# Patient Record
Sex: Female | Born: 2000
Health system: Southern US, Community
[De-identification: ages and names within clinical notes are randomized; demographics above are authoritative.]

## PROBLEM LIST (undated history)

## (undated) DIAGNOSIS — M4600 Spinal enthesopathy, site unspecified: Secondary | ICD-10-CM

## (undated) DIAGNOSIS — J45909 Unspecified asthma, uncomplicated: Secondary | ICD-10-CM

## (undated) DIAGNOSIS — E282 Polycystic ovarian syndrome: Secondary | ICD-10-CM

---
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
## 2019-07-07 ENCOUNTER — Ambulatory Visit (INDEPENDENT_AMBULATORY_CARE_PROVIDER_SITE_OTHER): Payer: Self-pay | Admitting: Pediatric Endocrinology

## 2019-07-10 ENCOUNTER — Other Ambulatory Visit (INDEPENDENT_AMBULATORY_CARE_PROVIDER_SITE_OTHER): Payer: Self-pay | Admitting: Pediatric Endocrinology

## 2019-07-10 DIAGNOSIS — L680 Hirsutism: Secondary | ICD-10-CM

## 2019-07-10 NOTE — Telephone Encounter (Signed)
°  Who's calling (name and relationship to patient) : Angela Fischer (pt)  Best contact number: [phone]  Provider they see: Baldo Ash  Reason for call: Need new Rx sent to pharmacy     PRESCRIPTION REFILL ONLY  Name of prescription:  Pharmacy: [facility] mail [facility]

## 2019-07-11 ENCOUNTER — Encounter (INDEPENDENT_AMBULATORY_CARE_PROVIDER_SITE_OTHER): Payer: Self-pay | Admitting: Pediatric Endocrinology

## 2019-07-11 ENCOUNTER — Ambulatory Visit (INDEPENDENT_AMBULATORY_CARE_PROVIDER_SITE_OTHER): Payer: 59 | Admitting: Pediatric Endocrinology

## 2019-07-11 ENCOUNTER — Other Ambulatory Visit: Payer: Self-pay

## 2019-07-11 VITALS — BP 110/64 | HR 84 | Ht 66.93 in | Wt 167.2 lb

## 2019-07-11 DIAGNOSIS — Z3041 Encounter for surveillance of contraceptive pills: Secondary | ICD-10-CM | POA: Diagnosis not present

## 2019-07-11 DIAGNOSIS — E288 Other ovarian dysfunction: Secondary | ICD-10-CM

## 2019-07-11 DIAGNOSIS — F411 Generalized anxiety disorder: Secondary | ICD-10-CM | POA: Diagnosis not present

## 2019-07-11 DIAGNOSIS — L680 Hirsutism: Secondary | ICD-10-CM | POA: Diagnosis not present

## 2019-07-11 DIAGNOSIS — F938 Other childhood emotional disorders: Secondary | ICD-10-CM

## 2019-07-11 DIAGNOSIS — Z3202 Encounter for pregnancy test, result negative: Secondary | ICD-10-CM

## 2019-07-11 LAB — POCT URINE PREGNANCY: Preg Test, Ur: NEGATIVE

## 2019-07-11 MED ORDER — NORGESTIMATE-ETH ESTRADIOL 0.25-35 MG-MCG PO TABS: 1.0000 | ORAL_TABLET | Freq: Every day | ORAL | 3 refills | Status: DC

## 2019-07-11 MED ORDER — SPIRONOLACTONE 50 MG PO TABS: 50.0000 mg | ORAL_TABLET | Freq: Every day | ORAL | 4 refills | Status: DC

## 2019-07-11 MED ORDER — HYDROXYZINE HCL 10 MG PO TABS: 10.0000 mg | ORAL_TABLET | Freq: Three times a day (TID) | ORAL | 1 refills | Status: DC | PRN

## 2019-07-11 NOTE — Patient Instructions (Signed)
Atarax as needed up to 3 doses (8 hours apart) per day. May take 2 tabs for sleep if needed.   Let me know if it is working via [facility]  Referral placed to Adolescent Medicine- if you haven't heard from them in a week- please let me know.

## 2019-07-11 NOTE — Progress Notes (Signed)
Subjective:  Subjective  Patient Name: Angela Fischer Date of Birth: 2000/11/02  MRN: [id number]  Angela Fischer  presents to clinic for follow up evaluation and management of her hirsutism.   HISTORY OF PRESENT ILLNESS:   Angela Fischer is a 18 y.o. mixed female   Angela Fischer was accompanied by her self  1. Angela Fischer was seen by her PCP in June 2017 for her 14 year WCC. At that visit she complained of excess body hair on her torso and face. She was referred to endocrinology for further evaluation.    2. Angela Fischer was last seen in pediatric endocrine clinic(telemed) on 12/31/18 n the interim she has been generally healthy.    She has had an increase recently in general anxiety. She did not feel that her Prozac was helpful- she felt that it made her feel like everything was going to kill her.   She has a therapist and last spoke with her 2 weeks ago.   She has continued on Sprintec. She stopped taking the Spironolactone for awhile but has restarted it. She realized it was making her hair finer when she was taking it.   She had a period that last an entire month last month. She was taking her OCP irregularly.  She was at [facility] for a short time (3 weeks). She feels that everything is different since she was there. Her period and discharges are different. She is having more discharge and it has a different odor (not bad) than previously. She also feels that it is thicker.   She feels that when she was at school she gained a lot of weight. There was a [employer], a subway, a coffee place, and burger/pancake place. She felt that she ate out a lot more. She was anxious about going into the dining hall.   She has not been exercising- she started working out more 2 days ago.   Her boy friend has high cholesterol.   She had unprotected intercourse 6 days ago and is anxious that she may be pregnant.    3. Pertinent Review of Systems:  Constitutional: The patient feels "anxious". The patient seems healthy and  active.  Eyes: Vision seems to be good. There are no recognized eye problems. Wears glasses.  Neck: The patient has no complaints of anterior neck swelling, soreness, tenderness, pressure, discomfort, or difficulty swallowing.   Heart: Heart rate increases with exercise or other physical activity. The patient has no complaints of palpitations, irregular heart beats, chest pain, or chest pressure.   Lungs: h/o asthma. +flu vax 2018.  Gastrointestinal: Bowel movents seem normal. The patient has no complaints of excessive hunger, acid reflux, upset stomach, stomach aches or pains, diarrhea, or constipation.  Legs: Muscle mass and strength seem normal. There are no complaints of numbness, tingling, burning, or pain. No edema is noted.  Feet: There are no obvious foot problems. There are no complaints of numbness, tingling, burning, or pain. No edema is noted. Neurologic: There are no recognized problems with muscle movement and strength, sensation, or coordination. GYN/GU: per HPI  Skin: s/p hair removal. Less acne than before.  PAST MEDICAL, FAMILY, AND SOCIAL HISTORY  Past Medical History:  Diagnosis Date  . Asthma   . PCOS (polycystic ovarian syndrome)   . Spinal enthesopathy (HCC)     Family History  Problem Relation Age of Onset  . Arthritis Maternal Grandmother   . Asthma Maternal Grandmother   . Heart disease Maternal Grandmother   . Hypertension Maternal Grandmother   .  Diabetes Maternal Grandmother   . Diabetes Paternal Grandmother   . Sleep apnea Paternal Grandmother      Current Outpatient Medications:  .  norgestimate-ethinyl estradiol (SPRINTEC 28) 0.25-35 MG-MCG tablet, Take 1 tablet by mouth daily., Disp: 3 Package, Rfl: 3 .  spironolactone (ALDACTONE) 50 MG tablet, Take 1 tablet (50 mg total) by mouth daily., Disp: 90 tablet, Rfl: 4 .  cetirizine (ZYRTEC) 10 MG chewable tablet, Chew 10 mg by mouth daily., Disp: , Rfl:  .  hydrOXYzine (ATARAX/VISTARIL) 10 MG tablet,  Take 1 tablet (10 mg total) by mouth 3 (three) times daily as needed (may take 2 tabs for sleep)., Disp: 90 tablet, Rfl: 1  Allergies as of 07/11/2019 - Review Complete 07/11/2019  Allergen Reaction Noted  . Peanuts [peanut oil] Anaphylaxis 06/22/2012  . Corn-containing products  10/18/2017  . Other Other (See Comments) 07/23/2017  . Shellfish allergy Other (See Comments) 06/22/2012  . Soy allergy  10/18/2017     reports that she has never smoked. She has never used smokeless tobacco. She reports that she does not drink alcohol or use drugs. Pediatric History  Patient Parents  . Angela Fischer,Angela Fischer (Mother)  . Nylund,[name] (Father)   Other Topics Concern  . Not on file  Social History Narrative   Goes to [employer], [place]    1. 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  2. Activities: Violin.   3. Primary Care Provider: Silvano RuskMiller, Robert C, MD  ROS: There are no other significant problems involving Angela Fischer's other body systems.    Objective:  Objective  Vital Signs:  BP 110/64   Pulse 84   Ht 5' 6.93" (1.7 m)   Wt 167 lb 3.2 oz (75.8 kg)   BMI 26.24 kg/m    Ht Readings from Last 3 Encounters:  07/11/19 5' 6.93" (1.7 m) (85 %, Z= 1.05)*  07/03/18 5' 7.52" (1.715 m) (90 %, Z= 1.30)*  10/16/17 5' 8.03" (1.728 m) (94 %, Z= 1.53)*   * Growth percentiles are based on CDC (Girls, 2-20 Years) data.   Wt Readings from Last 3 Encounters:  07/11/19 167 lb 3.2 oz (75.8 kg) (92 %, Z= 1.38)*  07/03/18 189 lb 9.6 oz (86 kg) (97 %, Z= 1.85)*  10/16/17 185 lb 6.4 oz (84.1 kg) (97 %, Z= 1.81)*   * Growth percentiles are based on CDC (Girls, 2-20 Years) data.   HC Readings from Last 3 Encounters:  [facility]   Body surface area is 1.89 meters squared. 85 %ile (Z= 1.05) based on CDC (Girls, 2-20 Years) Stature-for-age data based on Stature recorded on 07/11/2019. 92 %ile (Z= 1.38) based on CDC (Girls, 2-20 Years)  weight-for-age data using vitals from 07/11/2019.    PHYSICAL EXAM: Constitutional: The patient appears healthy and well nourished. The patient's height and weight are advanced for age. She has lost 22 pounds in the past year.   Head: The head is normocephalic. Face: The face appears normal. There are no obvious dysmorphic features.fine hair on upper lip, and sideburns. Fine  hair on chin/front of neck. - neck and chin have improved. Side burns and upper lip remain fine hair.   Eyes: The eyes appear to be normally formed and spaced. Gaze is conjugate. There is no obvious arcus or proptosis. Moisture appears normal. Ears: The ears are normally placed and appear externally normal. Mouth: The oropharynx and tongue appear normal. Dentition appears to be normal for age. Oral  moisture is normal. Neck: The neck appears to be visibly normal. No carotid bruits are noted. The thyroid gland is normal in size. The consistency of the thyroid gland is normal. The thyroid gland is not tender to palpation. No acanthosis Lungs: The lungs are clear to auscultation. Air movement is good.  Heart: Heart rate and rhythm are regular. Heart sounds S1 and S2 are normal. I did not appreciate any pathologic cardiac murmurs. Abdomen: The abdomen appears to be normal in size for the patient's age. Bowel sounds are normal. There is no obvious hepatomegaly, splenomegaly, or other mass effect.  Arms: Muscle size and bulk are normal for age.  Hands: There is no obvious tremor. Phalangeal and metacarpophalangeal joints are normal. Palmar muscles are normal for age. Palmar skin is normal. Palmar moisture is also normal. Legs: Muscles appear normal for age. No edema is present. Feet: Feet are normally formed. Dorsalis pedal pulses are normal. Neurologic: Strength is normal for age in both the upper and lower extremities. Muscle tone is normal. Sensation to touch is normal in both the legs and feet.   Puberty: Tanner stage pubic  hair: V Tanner stage breast/genital V.   LAB DATA:   Results for orders placed or performed in visit on 07/11/19 (from the past 672 hour(s))  POCT urine pregnancy   Collection Time: 07/11/19  9:11 AM  Result Value Ref Range   Preg Test, Ur Negative Negative      Assessment and Plan:  Assessment  ASSESSMENT: Latunya is a 18 y.o.  mixed race female who presents today for follow up evaluation of hirsutism and hyperandrogenism.   Hirsutism - She has had signficant laser hair removal - especially across her chest - She continues on spironolactone intermittently for anti androgen effect on hair growth - She feels that she is doing okay - Says that she will need additional laser hair removal  Hyperandrogenism - Continues on Sprintec OCP - Had extended menorrhagia last month but did not contact office - Has been taking OCP more consistently  Sexual Health - Is sexually active - Has had unprotected sex and is concerned that she might be pregnant - U preg negative today - Is taking OCP more consistently - Worried about changes in vaginal secretions  Weight - she feels that she is doing weill with her weight.  - she has reduced sugar drink intake - she is not getting some physical activity currently  Anxiety - She is having significant increase in anxiety since last visit - Was previously on Prozac but felt worse on it - Has been trying to get back in with her therapist (last visit 2 weeks ago) - Discussed options for medication management of anxiety - she wants to try CBD gummies. Discussed that CBD may be beneficial or may make her feel more anxious/paranoid. Discussed that as supplements it is unregulated and you do not know if you are getting any CBD or significantly different amount that labeled.  - Will trial Atarax prn for anxiety - Referral placed to Adolescent Med   PLAN:  1. Diagnostic: u preg neg   2. Therapeutic: continue sprintec and spironolactone. Need to not  skip pills. Start Atarax 10 mg TID PRN anxiety. Can take 20 mg before bed. Patient to send message in 1 week to let me know how she is doing.  3. Patient education: Discussed the above. Focused on sexual health, anxiety 4. Follow-up: Return in about 2 months (around 09/10/2019).  Dessa Phi, MD   Level of Service: This visit lasted in excess of 40 minutes. More than 50% of the visit was devoted to counseling.

## 2019-07-29 ENCOUNTER — Encounter: Payer: 59 | Admitting: Pediatrics

## 2019-07-29 NOTE — Progress Notes (Deleted)
THIS RECORD MAY CONTAIN CONFIDENTIAL INFORMATION THAT SHOULD NOT BE RELEASED WITHOUT REVIEW OF THE SERVICE PROVIDER.  Virtual Visit via Video Note  I connected with Angela Fischer 's {family members:20773}  on 07/29/19 at 11:30 AM EDT by a video enabled telemedicine application and verified that I am speaking with the correct person using two identifiers.    This patient visit was completed through the use of an audio/video or telephone encounter in the setting of the State of Emergency due to the COVID-19 Pandemic.  I discussed that the purpose of this telehealth visit is to provide medical care while limiting exposure to the novel coronavirus.       I discussed the limitations of evaluation and management by telemedicine and the availability of in person appointments.    The {family members:20773} expressed understanding and agreed to proceed.   The patient was physically located at *** in [place] or a state in which I am permitted to provide care. The patient and/or parent/guardian understood that s/he may incur co-pays and cost sharing, and agreed to the telemedicine visit. The visit was reasonable and appropriate under the circumstances given the patient's presentation at the time.   The patient and/or parent/guardian has been advised of the potential risks and limitations of this mode of treatment (including, but not limited to, the absence of in-person examination) and has agreed to be treated using telemedicine. The patient's/patient's family's questions regarding telemedicine have been answered.    As this visit was completed in an ambulatory virtual setting, the patient and/or parent/guardian has also been advised to contact their provider's office for worsening conditions, and seek emergency medical treatment and/or call 911 if the patient deems either necessary.   Team Care Documentation:  *** and I provided team documentation for this visit from [place], [place].  Dr. Delorse Lek  provided services for this visit from off-site.    Chief Complaint:    Angela Fischer is a 18 y.o. female referred by Silvano Rusk, MD here today for evaluation of ***.   Growth Chart Viewed? {YES/NO/NOT APPLICABLE:20182}  Previsit planning completed:  {YES/NO/NOT APPLICABLE:20182}   History was provided by the {CHL AMB PERSONS; PED RELATIVES/OTHER W/PATIENT:[phone]}.  PCP Confirmed?  {YES DJ:24268}  My Chart Activated?   {YES NO:22349}     History of Present Illness:  ***  No LMP recorded.  ROS:  ***  Allergies  Allergen Reactions  . Peanuts [Peanut Oil] Anaphylaxis  . Corn-Containing Products   . Other Other (See Comments)    Lima beans - Reaction unknown  Tree nuts   . Shellfish Allergy Other (See Comments)    Reaction unknown  . Soy Allergy    Outpatient Medications Prior to Visit  Medication Sig Dispense Refill  . cetirizine (ZYRTEC) 10 MG chewable tablet Chew 10 mg by mouth daily.    . hydrOXYzine (ATARAX/VISTARIL) 10 MG tablet Take 1 tablet (10 mg total) by mouth 3 (three) times daily as needed (may take 2 tabs for sleep). 90 tablet 1  . norgestimate-ethinyl estradiol (SPRINTEC 28) 0.25-35 MG-MCG tablet Take 1 tablet by mouth daily. 3 Package 3  . spironolactone (ALDACTONE) 50 MG tablet Take 1 tablet (50 mg total) by mouth daily. 90 tablet 4   No facility-administered medications prior to visit.      Patient Active Problem List   Diagnosis Date Noted  . Surveillance for birth control, oral contraceptives 12/31/2018  . MDD (major depressive disorder), recurrent severe, without psychosis (HCC) 07/24/2017  .  Anxiety disorder of adolescence 07/24/2017  . Disordered eating 07/17/2016  . Hirsutism 04/10/2016  . Hyperandrogenism 04/10/2016  . Overweight peds (BMI 85-94.9 percentile) 04/10/2016    Past Medical History:  Reviewed and updated?  {YES [id number] Past Medical History:  Diagnosis Date  . Asthma   . PCOS (polycystic ovarian syndrome)   .  Spinal enthesopathy ([place])     Family History: Reviewed and updated? {YES [id number] Family History  Problem Relation Age of Onset  . Arthritis Maternal Grandmother   . Asthma Maternal Grandmother   . Heart disease Maternal Grandmother   . Hypertension Maternal Grandmother   . Diabetes Maternal Grandmother   . Diabetes Paternal Grandmother   . Sleep apnea Paternal Grandmother     Confidentiality was discussed with the patient and if applicable, with caregiver as well.  Gender identity: *** Sex assigned at birth: *** Pronouns: {he/she/they:23295} Tobacco?  {YES/NO/WILD VELFY:10175} Drugs/ETOH?  {YES/NO/WILD ZWCHE:52778} Partner preference?  {CHL AMB PARTNER [phone]}  Sexually Active?  {YES/NO/WILD EUMPN:36144}  Pregnancy Prevention:  {Pregnancy Prevention:[phone]} Reviewed condoms:  {YES/NO/WILD RXVQM:08676} Reviewed EC:  {YES/NO/WILD PPJKD:32671}   History or current traumatic events (natural disaster, house fire, etc.)? {YES/NO/WILD IWPYK:99833} History or current physical trauma?  {YES/NO/WILD ASNKN:39767} History or current emotional trauma?  {YES/NO/WILD HALPF:79024} History or current sexual trauma?  {YES/NO/WILD OXBDZ:32992} History or current domestic or intimate partner violence?  {YES/NO/WILD EQAST:41962} History of bullying:  {YES/NO/WILD IWLNL:89211}  Trusted adult at home/school:  {YES/NO/WILD CARDS:18581} Feels safe at home:  {YES/NO/WILD HERDE:08144} Trusted friends:  {YES/NO/WILD YJEHU:31497} Feels safe at school:  {YES/NO/WILD WYOVZ:85885}  Suicidal or homicidal thoughts?   {YES/NO/WILD OYDXA:12878} Self injurious behaviors?  {YES/NO/WILD MVEHM:09470} Guns in the home?  {YES/NO/WILD JGGEZ:66294} {Common ambulatory SmartLinks:19316}  Visual Observations/Objective:  *** General Appearance: Well nourished well developed, in no apparent distress.  Eyes: conjunctiva no swelling or erythema ENT/Mouth: No hoarseness, No cough for duration of  visit.  Neck: Supple  Respiratory: Respiratory effort normal, normal rate, no retractions or distress.   Cardio: Appears well-perfused, noncyanotic Musculoskeletal: no obvious deformity Skin: visible skin without rashes, ecchymosis, erythema Neuro: Awake and oriented X 3,  Psych:  normal affect, Insight and Judgment appropriate.    Assessment/Plan: ***  I discussed the assessment and treatment plan with the patient and/or parent/guardian.  They were provided an opportunity to ask questions and all were answered.  They agreed with the plan and demonstrated an understanding of the instructions. They were advised to call back or seek an in-person evaluation in the emergency room if the symptoms worsen or if the condition fails to improve as anticipated.   Follow-up:   ***  Medical decision-making:   I spent *** minutes on this telehealth visit inclusive of face-to-face video and care coordination time I was located at *** during this encounter.   Jerilee Hoh, MD    CC: Normajean Baxter, MD, Normajean Baxter, MD

## 2019-08-02 NOTE — Progress Notes (Signed)
This encounter was created in error - please disregard.

## 2019-08-20 ENCOUNTER — Telehealth (INDEPENDENT_AMBULATORY_CARE_PROVIDER_SITE_OTHER): Payer: Self-pay | Admitting: Pediatric Endocrinology

## 2019-08-20 ENCOUNTER — Other Ambulatory Visit (INDEPENDENT_AMBULATORY_CARE_PROVIDER_SITE_OTHER): Payer: Self-pay | Admitting: *Deleted

## 2019-08-20 MED ORDER — HYDROXYZINE HCL 10 MG PO TABS: 10.0000 mg | ORAL_TABLET | Freq: Three times a day (TID) | ORAL | 0 refills | Status: DC | PRN

## 2019-08-20 NOTE — Telephone Encounter (Signed)
Routed to [facility].  Are you going to keep refilling this?

## 2019-08-20 NOTE — Telephone Encounter (Signed)
She is scheduled to see Henrene Pastor 12/15- so one more and then they should take over.

## 2019-08-20 NOTE — Telephone Encounter (Signed)
Spoke to patient, advised script sent for 1 month with no refills to get her to her appt with Dr. Henrene Pastor.

## 2019-08-20 NOTE — Telephone Encounter (Signed)
°  Who's calling (name and relationship to patient) : Angela Fischer (patient)  Best contact number: [phone]  Provider they see: [facility]  Reason for call: Need refill of medication    PRESCRIPTION REFILL ONLY  Name of prescription: Hydroxyzine 10mg    Pharmacy: [facility] [address]

## 2019-09-09 ENCOUNTER — Encounter (INDEPENDENT_AMBULATORY_CARE_PROVIDER_SITE_OTHER): Payer: Self-pay | Admitting: Pediatric Endocrinology

## 2019-09-09 ENCOUNTER — Other Ambulatory Visit: Payer: Self-pay

## 2019-09-09 ENCOUNTER — Ambulatory Visit (INDEPENDENT_AMBULATORY_CARE_PROVIDER_SITE_OTHER): Payer: 59 | Admitting: Pediatric Endocrinology

## 2019-09-09 VITALS — BP 108/60 | HR 80 | Ht 67.56 in | Wt 166.6 lb

## 2019-09-09 DIAGNOSIS — E663 Overweight: Secondary | ICD-10-CM

## 2019-09-09 DIAGNOSIS — Z23 Encounter for immunization: Secondary | ICD-10-CM | POA: Diagnosis not present

## 2019-09-09 DIAGNOSIS — E288 Other ovarian dysfunction: Secondary | ICD-10-CM | POA: Diagnosis not present

## 2019-09-09 DIAGNOSIS — Z68.41 Body mass index (BMI) pediatric, 85th percentile to less than 95th percentile for age: Secondary | ICD-10-CM

## 2019-09-09 LAB — POCT GLYCOSYLATED HEMOGLOBIN (HGB A1C): HbA1c POC (<> result, manual entry): 5.5 % (ref 4.0–5.6)

## 2019-09-09 LAB — POCT GLUCOSE (DEVICE FOR HOME USE): POC Glucose: 105 mg/dl — AB (ref 70–99)

## 2019-09-09 NOTE — Patient Instructions (Signed)
Flu shot today! Remember to move that arm! It will take 2 weeks for full immune effect. This injection may not prevent flu but should reduce severity of disease.   Continue Sprintec, spironolactone.   Adolescent Med visit in 2 weeks.

## 2019-09-09 NOTE — Progress Notes (Signed)
Subjective:  Subjective  Patient Name: Angela Fischer Date of Birth: [birth year]  MRN: [id number]  Angela Fischer  presents to clinic for follow up evaluation and management of her hirsutism.   HISTORY OF PRESENT ILLNESS:   Angela Fischer is a 18 y.o. mixed female   Angela Fischer was accompanied by her self   1. Angela Fischer was seen by her PCP in June 2017 for her 14 year [place]. At that visit she complained of excess body hair on her torso and face. She was referred to endocrinology for further evaluation.    2. Angela Fischer was last seen in pediatric endocrine clinic on 07/11/19. In the interim she has been generally healthy.    She has been taking her Atarax mostly for sleep. She hasn't felt that she has needed to take it during the day. She is not taking Prozac. She feels that overall her anxiety is improved. She is scheduled to see Adolescent Med on 12/15.   She has a therapist but hasn't spoken to her recently- Last visit was end of Sept/early Oct. She hasn't felt like she needs her.   She has continued on Sprintec and Spironolactone.   No new issues with hair growth.   She feels that her periods are more consistent now. She is not longer having heavy or long menses. She feels that her vaginal discharge has normalized.   She has not been exercising recently.   She is drinking mostly water and coffee.    3. Pertinent Review of Systems:  Constitutional: The patient feels "good". The patient seems healthy and active.  Eyes: Vision seems to be good. There are no recognized eye problems. Wears glasses.  Neck: The patient has no complaints of anterior neck swelling, soreness, tenderness, pressure, discomfort, or difficulty swallowing.   Heart: Heart rate increases with exercise or other physical activity. The patient has no complaints of palpitations, irregular heart beats, chest pain, or chest pressure.   Lungs: h/o asthma. Flu vax today.  Gastrointestinal: Bowel movents seem normal. The patient has no  complaints of excessive hunger, acid reflux, upset stomach, stomach aches or pains, diarrhea, or constipation.  Legs: Muscle mass and strength seem normal. There are no complaints of numbness, tingling, burning, or pain. No edema is noted.  Feet: There are no obvious foot problems. There are no complaints of numbness, tingling, burning, or pain. No edema is noted. Neurologic: There are no recognized problems with muscle movement and strength, sensation, or coordination. GYN/GU: per HPI  Skin: s/p hair removal. Less acne than before.  PAST MEDICAL, FAMILY, AND SOCIAL HISTORY  Past Medical History:  Diagnosis Date  . Asthma   . PCOS (polycystic ovarian syndrome)   . Spinal enthesopathy (HCC)     Family History  Problem Relation Age of Onset  . Arthritis Maternal Grandmother   . Asthma Maternal Grandmother   . Heart disease Maternal Grandmother   . Hypertension Maternal Grandmother   . Diabetes Maternal Grandmother   . Diabetes Paternal Grandmother   . Sleep apnea Paternal Grandmother      Current Outpatient Medications:  .  hydrOXYzine (ATARAX/VISTARIL) 10 MG tablet, Take 1 tablet (10 mg total) by mouth 3 (three) times daily as needed (may take 2 tabs for sleep)., Disp: 60 tablet, Rfl: 0 .  norgestimate-ethinyl estradiol (SPRINTEC 28) 0.25-35 MG-MCG tablet, Take 1 tablet by mouth daily., Disp: 3 Package, Rfl: 3 .  spironolactone (ALDACTONE) 50 MG tablet, Take 1 tablet (50 mg total) by mouth daily., Disp: 90 tablet, Rfl:  4 .  cetirizine (ZYRTEC) 10 MG chewable tablet, Chew 10 mg by mouth daily., Disp: , Rfl:   Allergies as of 09/09/2019 - Review Complete 09/09/2019  Allergen Reaction Noted  . Peanuts [peanut oil] Anaphylaxis 06/22/2012  . Corn-containing products  10/18/2017  . Other Other (See Comments) 07/23/2017  . Shellfish allergy Other (See Comments) 06/22/2012  . Soy allergy  10/18/2017     reports that she has never smoked. She has never used smokeless tobacco. She  reports that she does not drink alcohol or use drugs. Pediatric History  Patient Parents  . [name],[name] (Mother)  . Vitale,[name] (Father)   Other Topics Concern  . Not on file  Social History Narrative   Goes to [employer], [place]    1. 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 She is thinking of changing her major and doing a minor in performance. She is doing remote school.  2. Activities: Violin.   3. Primary Care Provider: Gweneth DimitriMcNeill, Wendy, MD  ROS: There are no other significant problems involving Angela Fischer's other body systems.    Objective:  Objective  Vital Signs:  BP 108/60   Pulse 80   Ht 5' 7.56" (1.716 m)   Wt 166 lb 9.6 oz (75.6 kg)   LMP 09/07/2019   BMI 25.66 kg/m   Ht Readings from Last 3 Encounters:  09/09/19 5' 7.56" (1.716 m) (90 %, Z= 1.29)*  07/11/19 5' 6.93" (1.7 m) (85 %, Z= 1.05)*  07/03/18 5' 7.52" (1.715 m) (90 %, Z= 1.30)*   * Growth percentiles are based on CDC (Girls, 2-20 Years) data.   Wt Readings from Last 3 Encounters:  09/09/19 166 lb 9.6 oz (75.6 kg) (91 %, Z= 1.36)*  07/11/19 167 lb 3.2 oz (75.8 kg) (92 %, Z= 1.38)*  07/03/18 189 lb 9.6 oz (86 kg) (97 %, Z= 1.85)*   * Growth percentiles are based on CDC (Girls, 2-20 Years) data.   HC Readings from Last 3 Encounters:  No data found for [place]   Body surface area is 1.9 meters squared. 90 %ile (Z= 1.29) based on CDC (Girls, 2-20 Years) Stature-for-age data based on Stature recorded on 09/09/2019. 91 %ile (Z= 1.36) based on CDC (Girls, 2-20 Years) weight-for-age data using vitals from 09/09/2019.   PHYSICAL EXAM:  Constitutional: The patient appears healthy and well nourished. The patient's height and weight are advanced for age. Weight is fairly stable.    Head: The head is normocephalic. Face: The face appears normal. There are no obvious dysmorphic features.fine hair on upper lip, and sideburns. Fine  hair on chin/front of neck. -  neck and chin have improved. Side burns and upper lip remain fine hair.   Eyes: The eyes appear to be normally formed and spaced. Gaze is conjugate. There is no obvious arcus or proptosis. Moisture appears normal. Ears: The ears are normally placed and appear externally normal. Mouth: The oropharynx and tongue appear normal. Dentition appears to be normal for age. Oral moisture is normal. Neck: The neck appears to be visibly normal. No carotid bruits are noted. The thyroid gland is normal in size. The consistency of the thyroid gland is normal. The thyroid gland is not tender to palpation. No acanthosis Lungs: The lungs are clear to auscultation. Air movement is good.  Heart: Heart rate and rhythm are regular. Heart sounds S1 and S2 are normal. I did not appreciate any pathologic cardiac murmurs. Abdomen: The abdomen appears to be  normal in size for the patient's age. Bowel sounds are normal. There is no obvious hepatomegaly, splenomegaly, or other mass effect.  Arms: Muscle size and bulk are normal for age.  Hands: There is no obvious tremor. Phalangeal and metacarpophalangeal joints are normal. Palmar muscles are normal for age. Palmar skin is normal. Palmar moisture is also normal. Legs: Muscles appear normal for age. No edema is present. Feet: Feet are normally formed. Dorsalis pedal pulses are normal. Neurologic: Strength is normal for age in both the upper and lower extremities. Muscle tone is normal. Sensation to touch is normal in both the legs and feet.   Puberty: Tanner stage pubic hair: V Tanner stage breast/genital V.   LAB DATA:    Results for orders placed or performed in visit on 09/09/19 (from the past 672 hour(s))  POCT Glucose (Device for Home Use)   Collection Time: 09/09/19 10:39 AM  Result Value Ref Range   Glucose Fasting, POC     POC Glucose 105 (A) 70 - 99 mg/dl  POCT HgB W3S   Collection Time: 09/09/19 10:41 AM  Result Value Ref Range   Hemoglobin A1C     HbA1c  POC (<> result, manual entry) 5.5 4.0 - 5.6 %   HbA1c, POC (prediabetic range)     HbA1c, POC (controlled diabetic range)        Assessment and Plan:  Assessment  ASSESSMENT: Angela Fischer is a 18 y.o.  mixed race female who presents today for follow up evaluation of hirsutism and hyperandrogenism.   Hirsutism - She has had signficant laser hair removal - especially across her chest - She continues on spironolactone for anti androgen effect on hair growth - She feels that she is doing okay - Says that she will need additional laser hair removal  Hyperandrogenism - Continues on Sprintec OCP - Has been taking OCP more consistently - Is no longer having breakthrough bleeding or menorrhagia  Sexual Health - Is sexually active - Is taking OCP more consistently - Scheduled with adolescent medicine later this month  Weight - she feels that she is doing weill with her weight.  - she has reduced sugar drink intake - she is not getting some physical activity currently  Anxiety - Feels anxiety is well controlled on Atarax.  - Was previously on Prozac but felt worse on it - Scheduled to see Adolescent medicine later this month.    PLAN:   1. Diagnostic: A1C today as above.  2. Therapeutic: continue sprintec and spironolactone. Need to not skip pills.  Atarax 10 mg TID PRN anxiety. Can take 20 mg before bed.  3. Patient education: Discussed the above.  4. Follow-up: Return in about 6 months (around 03/09/2020).       Dessa Phi, MD  Level of Service: This visit lasted in excess of 25 minutes. More than 50% of the visit was devoted to counseling.

## 2019-09-23 ENCOUNTER — Other Ambulatory Visit (HOSPITAL_COMMUNITY)
Admission: RE | Admit: 2019-09-23 | Discharge: 2019-09-23 | Disposition: A | Discharge status: Home / Self Care | Payer: 59 | Source: Ambulatory Visit | Attending: Pediatrics | Admitting: Pediatrics

## 2019-09-23 ENCOUNTER — Ambulatory Visit (INDEPENDENT_AMBULATORY_CARE_PROVIDER_SITE_OTHER): Payer: 59 | Admitting: Pediatrics

## 2019-09-23 ENCOUNTER — Other Ambulatory Visit: Payer: Self-pay

## 2019-09-23 VITALS — BP 116/73 | HR 81 | Ht 67.72 in | Wt 171.0 lb

## 2019-09-23 DIAGNOSIS — Z113 Encounter for screening for infections with a predominantly sexual mode of transmission: Secondary | ICD-10-CM | POA: Diagnosis not present

## 2019-09-23 DIAGNOSIS — F411 Generalized anxiety disorder: Secondary | ICD-10-CM

## 2019-09-23 DIAGNOSIS — F332 Major depressive disorder, recurrent severe without psychotic features: Secondary | ICD-10-CM

## 2019-09-23 DIAGNOSIS — G479 Sleep disorder, unspecified: Secondary | ICD-10-CM

## 2019-09-23 DIAGNOSIS — Z3202 Encounter for pregnancy test, result negative: Secondary | ICD-10-CM

## 2019-09-23 DIAGNOSIS — F509 Eating disorder, unspecified: Secondary | ICD-10-CM

## 2019-09-23 DIAGNOSIS — L680 Hirsutism: Secondary | ICD-10-CM

## 2019-09-23 LAB — POCT URINE PREGNANCY: Preg Test, Ur: NEGATIVE

## 2019-09-23 MED ORDER — HYDROXYZINE HCL 25 MG PO TABS: 25.0000 mg | ORAL_TABLET | Freq: Four times a day (QID) | ORAL | 1 refills | Status: DC | PRN

## 2019-09-23 MED ORDER — NORGESTIMATE-ETH ESTRADIOL 0.25-35 MG-MCG PO TABS: 1.0000 | ORAL_TABLET | Freq: Every day | ORAL | 3 refills | Status: DC

## 2019-09-23 MED ORDER — SERTRALINE HCL 50 MG PO TABS: 50.0000 mg | ORAL_TABLET | Freq: Every day | ORAL | 2 refills | Status: DC

## 2019-09-23 MED ORDER — HYDROXYZINE HCL 25 MG PO TABS: 25.0000 mg | ORAL_TABLET | Freq: Four times a day (QID) | ORAL | 0 refills | Status: DC | PRN

## 2019-09-23 NOTE — Patient Instructions (Addendum)
Www.besider.org has great information about birth control options.   Can use one to two tablets of hydroxyzine for sleep.   Please reachout if increased anxiety with starting sertraline
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
## 2019-09-24 ENCOUNTER — Encounter (INDEPENDENT_AMBULATORY_CARE_PROVIDER_SITE_OTHER): Payer: Self-pay

## 2019-10-02 LAB — URINE CYTOLOGY ANCILLARY ONLY
Bacterial Vaginitis-Urine: NEGATIVE
Candida Urine: NEGATIVE
Chlamydia: NEGATIVE
Comment: NEGATIVE
Comment: NEGATIVE
Comment: NORMAL
Neisseria Gonorrhea: NEGATIVE
Trichomonas: NEGATIVE

## 2019-10-07 ENCOUNTER — Ambulatory Visit (INDEPENDENT_AMBULATORY_CARE_PROVIDER_SITE_OTHER): Payer: 59 | Admitting: Pediatrics

## 2019-10-07 DIAGNOSIS — E282 Polycystic ovarian syndrome: Secondary | ICD-10-CM | POA: Diagnosis not present

## 2019-10-07 DIAGNOSIS — F4323 Adjustment disorder with mixed anxiety and depressed mood: Secondary | ICD-10-CM | POA: Diagnosis not present

## 2019-10-07 DIAGNOSIS — Z3041 Encounter for surveillance of contraceptive pills: Secondary | ICD-10-CM | POA: Diagnosis not present

## 2019-10-07 MED ORDER — DESVENLAFAXINE SUCCINATE ER 25 MG PO TB24: 25.0000 mg | ORAL_TABLET | Freq: Every day | ORAL | 1 refills | Status: DC
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
## 2019-10-21 ENCOUNTER — Ambulatory Visit (INDEPENDENT_AMBULATORY_CARE_PROVIDER_SITE_OTHER): Payer: 59 | Admitting: Pediatrics

## 2019-10-21 DIAGNOSIS — J4520 Mild intermittent asthma, uncomplicated: Secondary | ICD-10-CM | POA: Insufficient documentation

## 2019-10-21 DIAGNOSIS — T782XXA Anaphylactic shock, unspecified, initial encounter: Secondary | ICD-10-CM | POA: Insufficient documentation

## 2019-10-21 DIAGNOSIS — F411 Generalized anxiety disorder: Secondary | ICD-10-CM | POA: Diagnosis not present

## 2019-10-21 DIAGNOSIS — F4323 Adjustment disorder with mixed anxiety and depressed mood: Secondary | ICD-10-CM | POA: Diagnosis not present

## 2019-10-21 DIAGNOSIS — U071 COVID-19: Secondary | ICD-10-CM | POA: Diagnosis not present

## 2019-10-21 DIAGNOSIS — G44209 Tension-type headache, unspecified, not intractable: Secondary | ICD-10-CM | POA: Diagnosis not present

## 2019-10-21 MED ORDER — DESVENLAFAXINE SUCCINATE ER 25 MG PO TB24: 50.0000 mg | ORAL_TABLET | Freq: Every day | ORAL | 0 refills | Status: DC

## 2019-10-21 NOTE — Progress Notes (Signed)
This note is not being shared with the patient for the following reason: To respect privacy (The patient or proxy has requested that the information not be shared).  THIS RECORD MAY CONTAIN CONFIDENTIAL INFORMATION THAT SHOULD NOT BE RELEASED WITHOUT REVIEW OF THE SERVICE PROVIDER.  Virtual Follow-Up Visit via Video Note  I connected with Angela Fischer 's patient  on 10/21/19 at  1:30 PM EST by a video enabled telemedicine application and verified that I am speaking with the correct person using two identifiers.    This patient visit was completed through the use of an audio/video or telephone encounter in the setting of the State of Emergency due to the COVID-19 Pandemic.  I discussed that the purpose of this telehealth visit is to provide medical care while limiting exposure to the novel coronavirus.       I discussed the limitations of evaluation and management by telemedicine and the availability of in person appointments.    The patient  expressed understanding and agreed to proceed.   The patient was physically located at [place] in [place] or a state in which I am permitted to provide care. The patient and/or parent/guardian understood that s/he may incur co-pays and cost sharing, and agreed to the telemedicine visit. The visit was reasonable and appropriate under the circumstances given the patient's presentation at the time.   The patient and/or parent/guardian has been advised of the potential risks and limitations of this mode of treatment (including, but not limited to, the absence of in-person examination) and has agreed to be treated using telemedicine. The patient's/patient's family's questions regarding telemedicine have been answered.    As this visit was completed in an ambulatory virtual setting, the patient and/or parent/guardian has also been advised to contact their provider's office for worsening conditions, and seek emergency medical treatment and/or call 911 if  the patient deems either necessary.   Team Care Documentation:  Team care documentation used during this visit? yes Team care members present and location: Angela Fischer, [facility] [name] is a 19 y.o. female with a history of anxiety, depression, referred by [name], MD here today for follow-up of her anxiety.    Growth Chart Viewed? yes  Previsit planning completed:  yes   History was provided by the patient.  PCP Confirmed?  yes  My Chart Activated?   yes    Plan from Last Visit:    At her last visit, patient described significant anxiety including, paranoid about things she cannot control, obsessive thoughts about fears, and anxiety about returning to school. At her last visit, it had ben 2-3 months since she saw her therapist [name]) and planned to reach out.   She had been started on sertraline in the past given her dificulty functioning due to her symptoms. At her last visit she reported improvement in anxiety with sertraline, however felt more activated (more paranoid). Of note, she has been on fluoxetine in the past as well and had similar response. Thus she was started on Pristiq at 25 mg 2 weeks ago with a plan to increase in 2-3 weeks if needed.  Chief Complaint: anxiety  History of Present Illness:   She started the Pristiq 2 weeks ago, she remembers to take in 5-6 days a week.  Current symptoms: She reports that she feels fine and that she won't really know if it is working until she gets back to school since that is a big stressor for her. She  has had some headaches.  hydroxyzine use: always takes if for sleep; has been taking it the same  Therapy: has not scheduled appt; therapist was not in town. Will call today for appt. Describes relationship with therapist as good.  SI: none, no HI Every day headaches for the last week or so but was tested for COVID yesterday (1/11) and is positive after a known exposure last Monday. No other  appreciable symptoms.  No nausea/vomiting Sleep: hard to say; has bad dreams a lot; no issues with sleep initiation or staying asleep; has nightmares - comes and goes, had these before medication start    Patient's last menstrual period was 10/02/2019.  Review of Systems  Constitutional: Negative for chills, fever and malaise/fatigue.  HENT: Negative for sore throat.   Eyes: Negative for blurred vision.  Respiratory: Negative for cough and shortness of breath.   Cardiovascular: Negative for chest pain and palpitations.  Gastrointestinal: Negative for abdominal pain and nausea.  Genitourinary: Negative for dysuria and urgency.  Musculoskeletal: Negative for myalgias.  Skin: Negative for rash.  Neurological: Positive for headaches.  Psychiatric/Behavioral: Positive for depression. The patient is nervous/anxious.     Allergies  Allergen Reactions  . Peanuts [Peanut Oil] Anaphylaxis  . Corn-Containing Products   . Other Other (See Comments)    Lima beans - Reaction unknown  Tree nuts   . Shellfish Allergy Other (See Comments)    Reaction unknown  . Soy Allergy    Outpatient Medications Prior to Visit  Medication Sig Dispense Refill  . cetirizine (ZYRTEC) 10 MG chewable tablet Chew 10 mg by mouth daily.    . hydrOXYzine (ATARAX/VISTARIL) 25 MG tablet Take 1 tablet (25 mg total) by mouth every 6 (six) hours as needed for anxiety. Take 25 mg at bedtime 45 tablet 1  . norgestimate-ethinyl estradiol (SPRINTEC 28) 0.25-35 MG-MCG tablet Take 1 tablet by mouth daily. 3 Package 3  . spironolactone (ALDACTONE) 50 MG tablet Take 1 tablet (50 mg total) by mouth daily. 90 tablet 4  . Desvenlafaxine Succinate ER (PRISTIQ) 25 MG TB24 Take 25 mg by mouth daily. 30 tablet 1  . sertraline (ZOLOFT) 50 MG tablet Take 1 tablet (50 mg total) by mouth daily. 30 tablet 2   No facility-administered medications prior to visit.     Patient Active Problem List   Diagnosis Date Noted  . Asthma,  intermittent 10/21/2019  . Tension headache 10/21/2019  . Anaphylaxis 10/21/2019  . COVID-19 10/21/2019  . Surveillance for birth control, oral contraceptives 12/31/2018  . MDD (major depressive disorder), recurrent severe, without psychosis ([place]) 07/24/2017  . Anxiety disorder of adolescence 07/24/2017  . Disordered eating 07/17/2016  . Hirsutism 04/10/2016  . Hyperandrogenism 04/10/2016  . Overweight peds (BMI 85-94.9 percentile) 04/10/2016    Past Medical History:  Reviewed and updated?  yes Past Medical History:  Diagnosis Date  . Asthma   . PCOS (polycystic ovarian syndrome)   . Spinal enthesopathy ([place])     Family History: Reviewed and updated? yes Family History  Problem Relation Age of Onset  . Arthritis Maternal Grandmother   . Asthma Maternal Grandmother   . Heart disease Maternal Grandmother   . Hypertension Maternal Grandmother   . Diabetes Maternal Grandmother   . Diabetes Paternal Grandmother   . Sleep apnea Paternal Grandmother     Confidentiality was discussed with the patient and if applicable, with caregiver as well.  The following portions of the patient's history were reviewed and updated as appropriate: allergies,  current medications, past family history, past medical history, past social history, past surgical history and problem list.  Visual Observations/Objective:   General Appearance: Well nourished well developed, in no apparent distress.  ENT/Mouth: No hoarseness, No cough for duration of visit.  Respiratory: Respiratory effort normal, no distress.   Neuro: Awake and oriented X 3,  Psych:  normal affect, Insight and Judgment appropriate.    Assessment/Plan: 1. Adjustment disorder with mixed anxiety and depressed mood -increased from pristiq 25 mg to 50 mg ER daily  -continue with therapy, schedule ASAP   2. COVID-19 -since exposure headaches were more prevalent, likely symptomatic for COVID  -advised 10-14 days from symptoms onset for  isolation  - take albuterol inhaler PRN  3. Tension headache -Tylenol - take 500 mg PRN -Add 600 mg ibuprofen PRN -taking vitamin C, vitamin D, and multivitamin    I discussed the assessment and treatment plan with the patient and/or parent/guardian.  They were provided an opportunity to ask questions and all were answered.  They agreed with the plan and demonstrated an understanding of the instructions. They were advised to call back or seek an in-person evaluation in the emergency room if the symptoms worsen or if the condition fails to improve as anticipated.   Follow-up:   4 week video visit   Medical decision-making:   I spent 25 minutes on this telehealth visit inclusive of face-to-face video and care coordination time I was located remote in [place] during this encounter.   Angela Griffes, MD    CC: [name], MD, [name], MD

## 2019-11-18 ENCOUNTER — Telehealth (INDEPENDENT_AMBULATORY_CARE_PROVIDER_SITE_OTHER): Payer: 59 | Admitting: Pediatrics

## 2019-11-18 DIAGNOSIS — F411 Generalized anxiety disorder: Secondary | ICD-10-CM | POA: Diagnosis not present

## 2019-11-18 DIAGNOSIS — F332 Major depressive disorder, recurrent severe without psychotic features: Secondary | ICD-10-CM | POA: Diagnosis not present

## 2019-11-18 DIAGNOSIS — L680 Hirsutism: Secondary | ICD-10-CM | POA: Diagnosis not present

## 2019-11-18 DIAGNOSIS — Z8616 Personal history of COVID-19: Secondary | ICD-10-CM | POA: Diagnosis not present

## 2019-11-18 MED ORDER — HYDROXYZINE HCL 25 MG PO TABS: 25.0000 mg | ORAL_TABLET | Freq: Four times a day (QID) | ORAL | 1 refills | Status: DC | PRN

## 2019-11-18 MED ORDER — SPIRONOLACTONE 50 MG PO TABS: 50.0000 mg | ORAL_TABLET | Freq: Every day | ORAL | 4 refills | Status: AC

## 2019-11-18 MED ORDER — DESVENLAFAXINE SUCCINATE ER 50 MG PO TB24: 50.0000 mg | ORAL_TABLET | Freq: Every day | ORAL | 1 refills | Status: AC

## 2019-11-18 NOTE — Progress Notes (Signed)
This note is not being shared with the patient for the following reason: To respect privacy (The patient or proxy has requested that the information not be shared).  THIS RECORD MAY CONTAIN CONFIDENTIAL INFORMATION THAT SHOULD NOT BE RELEASED WITHOUT REVIEW OF THE SERVICE PROVIDER.  Virtual Follow-Up Visit via Video Note  I connected with Angela Fischer 's patient  on 11/18/19 at  2:00 PM EST by a video enabled telemedicine application and verified that I am speaking with the correct person using two identifiers.   Patient/parent location: home   I discussed the limitations of evaluation and management by telemedicine and the availability of in person appointments.  I discussed that the purpose of this telehealth visit is to provide medical care while limiting exposure to the novel coronavirus.  The patient expressed understanding and agreed to proceed.   Angela Fischer is a 19 y.o. female referred by Cari Caraway, MD here today for follow-up of anxiety ,depression, insomnia.  Previsit planning completed:  yes   History was provided by the patient.  Plan from Last Visit:   Increase desvenlafaxine to 50 mg   Chief Complaint: Med f/u  History of Present Illness:  Feeling better from COVID.   Med increase at last visit. She feels like it is working. She thought coming back to school would make things worse but she has been ok. At [employer]. Most classes are online, one that is not.   Anxiety: 3/10. Feels like at the moment that is good.  Depression: 4/10. Denies self harm thoughts or SI.   Trying hard to fight depression. She feels that she is "being  More healthy"- not wait till late in the day to eat something, started going to the gym and get stuff done ahead of time so less stress.   Playing phone tag with therapist but hasn't managed to set up an appointment. Does still think she needs an appointment. Plans to reach out today.   Hydroxyzine PRN for sleep about 2-3 times a week.  Remembering pristiq daily.    Review of Systems  Constitutional: Negative for malaise/fatigue.  Eyes: Negative for double vision.  Respiratory: Negative for shortness of breath.   Cardiovascular: Negative for chest pain and palpitations.  Gastrointestinal: Positive for abdominal pain. Negative for constipation, diarrhea, nausea and vomiting.  Genitourinary: Negative for dysuria.  Musculoskeletal: Negative for joint pain and myalgias.  Skin: Negative for rash.  Neurological: Negative for dizziness and headaches.  Endo/Heme/Allergies: Does not bruise/bleed easily.  Psychiatric/Behavioral: Positive for depression. Negative for suicidal ideas. The patient is nervous/anxious and has insomnia.      Allergies  Allergen Reactions  . Peanuts [Peanut Oil] Anaphylaxis  . Corn-Containing Products   . Other Other (See Comments)    Lima beans - Reaction unknown  Tree nuts   . Shellfish Allergy Other (See Comments)    Reaction unknown  . Soy Allergy    Outpatient Medications Prior to Visit  Medication Sig Dispense Refill  . cetirizine (ZYRTEC) 10 MG chewable tablet Chew 10 mg by mouth daily.    Marland Kitchen Desvenlafaxine Succinate ER (PRISTIQ) 25 MG TB24 Take 50 mg by mouth daily. 45 tablet 0  . hydrOXYzine (ATARAX/VISTARIL) 25 MG tablet Take 1 tablet (25 mg total) by mouth every 6 (six) hours as needed for anxiety. Take 25 mg at bedtime 45 tablet 1  . norgestimate-ethinyl estradiol (SPRINTEC 28) 0.25-35 MG-MCG tablet Take 1 tablet by mouth daily. 3 Package 3  . spironolactone (ALDACTONE) 50 MG tablet Take  1 tablet (50 mg total) by mouth daily. 90 tablet 4   No facility-administered medications prior to visit.     Patient Active Problem List   Diagnosis Date Noted  . Asthma, intermittent 10/21/2019  . Tension headache 10/21/2019  . Anaphylaxis 10/21/2019  . COVID-19 10/21/2019  . Surveillance for birth control, oral contraceptives 12/31/2018  . MDD (major depressive disorder), recurrent  severe, without psychosis ([facility]) 07/24/2017  . Anxiety disorder of adolescence 07/24/2017  . Disordered eating 07/17/2016  . Hirsutism 04/10/2016  . Hyperandrogenism 04/10/2016  . Overweight peds (BMI 85-94.9 percentile) 04/10/2016    The following portions of the patient's history were reviewed and updated as appropriate: allergies, current medications, past family history, past medical history, past social history, past surgical history and problem list.  Visual Observations/Objective:   General Appearance: Well nourished well developed, in no apparent distress.  Eyes: conjunctiva no swelling or erythema ENT/Mouth: No hoarseness, No cough for duration of visit.  Neck: Supple  Respiratory: Respiratory effort normal, normal rate, no retractions or distress.   Cardio: Appears well-perfused, noncyanotic Musculoskeletal: no obvious deformity Skin: visible skin without rashes, ecchymosis, erythema Neuro: Awake and oriented X 3,  Psych:  normal affect, Insight and Judgment appropriate.    Assessment/Plan: 1. MDD (major depressive disorder), recurrent severe, without psychosis ([facility]) It was somewhat unclear if she was taking 25 mg or 50 mg of pristiq, however, we will move forward with 50 mg as a maintenance dose regardless. [facility] the correct rx to [facility]. Depression is improving and she is improving her self care.   2. Hirsutism Per endo, refilled per request.  - spironolactone (ALDACTONE) 50 MG tablet; Take 1 tablet (50 mg total) by mouth daily.  Dispense: 90 tablet; Refill: 4  3. Anxiety state Continue hydroxyzine PRN and at bedtime. Overall improving.  - hydrOXYzine (ATARAX/VISTARIL) 25 MG tablet; Take 1 tablet (25 mg total) by mouth every 6 (six) hours as needed for anxiety. Take 25 mg at bedtime  Dispense: 45 tablet; Refill: 1 - desvenlafaxine (PRISTIQ) 50 MG 24 hr tablet; Take 1 tablet (50 mg total) by mouth daily.  Dispense: 90 tablet; Refill: 1    I  discussed the assessment and treatment plan with the patient and/or parent/guardian.  They were provided an opportunity to ask questions and all were answered.  They agreed with the plan and demonstrated an understanding of the instructions. They were advised to call back or seek an in-person evaluation in the emergency room if the symptoms worsen or if the condition fails to improve as anticipated.   Follow-up:   8 weeks or sooner as needed   Medical decision-making:   I spent 15 minutes on this telehealth visit inclusive of face-to-face video and care coordination time I was located off site during this encounter.   Alfonso Ramus, FNP    CC: [name], MD, [name], MD

## 2020-02-09 DIAGNOSIS — G479 Sleep disorder, unspecified: Secondary | ICD-10-CM | POA: Insufficient documentation

## 2020-02-09 DIAGNOSIS — F411 Generalized anxiety disorder: Secondary | ICD-10-CM | POA: Insufficient documentation

## 2020-03-09 ENCOUNTER — Ambulatory Visit (INDEPENDENT_AMBULATORY_CARE_PROVIDER_SITE_OTHER): Payer: 59 | Admitting: Pediatric Endocrinology

## 2020-05-10 ENCOUNTER — Other Ambulatory Visit (INDEPENDENT_AMBULATORY_CARE_PROVIDER_SITE_OTHER): Payer: Self-pay | Admitting: Pediatric Endocrinology

## 2020-05-10 DIAGNOSIS — L680 Hirsutism: Secondary | ICD-10-CM

## 2020-05-11 NOTE — Telephone Encounter (Signed)
Looks like this should go to Adolescent. Can we forward it?

## 2020-05-25 ENCOUNTER — Ambulatory Visit (INDEPENDENT_AMBULATORY_CARE_PROVIDER_SITE_OTHER): Payer: 59 | Admitting: Pediatric Endocrinology

## 2020-05-25 ENCOUNTER — Encounter (INDEPENDENT_AMBULATORY_CARE_PROVIDER_SITE_OTHER): Payer: Self-pay | Admitting: Pediatric Endocrinology

## 2020-05-25 ENCOUNTER — Other Ambulatory Visit: Payer: Self-pay

## 2020-05-25 VITALS — BP 112/66 | HR 80 | Wt 171.2 lb

## 2020-05-25 DIAGNOSIS — Z68.41 Body mass index (BMI) pediatric, 85th percentile to less than 95th percentile for age: Secondary | ICD-10-CM

## 2020-05-25 DIAGNOSIS — L680 Hirsutism: Secondary | ICD-10-CM | POA: Diagnosis not present

## 2020-05-25 DIAGNOSIS — F411 Generalized anxiety disorder: Secondary | ICD-10-CM | POA: Diagnosis not present

## 2020-05-25 DIAGNOSIS — E663 Overweight: Secondary | ICD-10-CM

## 2020-05-25 DIAGNOSIS — Z3041 Encounter for surveillance of contraceptive pills: Secondary | ICD-10-CM | POA: Diagnosis not present

## 2020-05-25 LAB — POCT GLYCOSYLATED HEMOGLOBIN (HGB A1C): Hemoglobin A1C: 5 % (ref 4.0–5.6)

## 2020-05-25 LAB — POCT GLUCOSE (DEVICE FOR HOME USE): POC Glucose: 99 mg/dl (ref 70–99)

## 2020-05-25 MED ORDER — HYDROXYZINE HCL 25 MG PO TABS: 25.0000 mg | ORAL_TABLET | Freq: Four times a day (QID) | ORAL | 1 refills | Status: DC | PRN

## 2020-05-25 NOTE — Progress Notes (Signed)
Subjective:  Subjective  Patient Name: Angela Fischer Date of Birth: 01-05-2001  MRN: [id number]  Angela Fischer Opie  presents to clinic for follow up evaluation and management of her hirsutism.   HISTORY OF PRESENT ILLNESS:   [name] is a 19 y.o. mixed female   [name] was unaccompanied    1. [name] was seen by her PCP in June 2017 for her 14 year WCC. At that visit she complained of excess body hair on her torso and face. She was referred to endocrinology for further evaluation.    2. [name] was last seen in pediatric endocrine clinic on 09/09/19. In the interim she has been generally healthy.    She is returning to campus tomorrow. She does not know how long they will keep the students on campus this year given the increase in Covid cases. She has received both her doses of the vaccine. She is unsure if vaccines are mandatory at school this year. She will also need to get tested every 2 weeks.   She has not needed Atarax over the summer. She has been following with [facility] Citizenaroline and with Dr. Marina GoodellPerry in the adolescent clinic. She is not currently on any medication for anxiety or depression. Her psychiatrist wants her to restart her Pristiq at a higher dose (50 mg). She is picking that up from the pharmacy today.   She worked this summer for at least 50 hours a week. She has not had much time for self care.   She has continued to take her Sprintec and Spironolactone. She feels that they are working ok. She will occasionally forget to take them and will get breakthrough bleeding.   Periods are fairly regular. She no longer has menorrhagia or extended cycles.   She has been drinking water and coffee.    3. Pertinent Review of Systems:  Constitutional: The patient feels "stressed". The patient seems healthy and active.  Eyes: Vision seems to be good. There are no recognized eye problems. Wears glasses.  Neck: The patient has no complaints of anterior neck swelling, soreness, tenderness, pressure,  discomfort, or difficulty swallowing.   Heart: Heart rate increases with exercise or other physical activity. The patient has no complaints of palpitations, irregular heart beats, chest pain, or chest pressure.   Lungs: h/o asthma. Gastrointestinal: Bowel movents seem normal. The patient has no complaints of excessive hunger, acid reflux, upset stomach, stomach aches or pains, diarrhea, or constipation.  Legs: Muscle mass and strength seem normal. There are no complaints of numbness, tingling, burning, or pain. No edema is noted.  Feet: There are no obvious foot problems. There are no complaints of numbness, tingling, burning, or pain. No edema is noted. Neurologic: There are no recognized problems with muscle movement and strength, sensation, or coordination. GYN/GU: per HPI  Skin: s/p hair removal. Less acne than before.  PAST MEDICAL, FAMILY, AND SOCIAL HISTORY  Past Medical History:  Diagnosis Date  . Asthma   . PCOS (polycystic ovarian syndrome)   . Spinal enthesopathy (HCC)     Family History  Problem Relation Age of Onset  . Arthritis Maternal Grandmother   . Asthma Maternal Grandmother   . Heart disease Maternal Grandmother   . Hypertension Maternal Grandmother   . Diabetes Maternal Grandmother   . Diabetes Paternal Grandmother   . Sleep apnea Paternal Grandmother      Current Outpatient Medications:  .  albuterol (VENTOLIN HFA) 108 (90 Base) MCG/ACT inhaler, Inhale into the lungs., Disp: , Rfl:  .  cetirizine (ZYRTEC) 10 MG chewable tablet, Chew 10 mg by mouth daily., Disp: , Rfl:  .  EPINEPHrine 0.3 mg/0.3 mL IJ SOAJ injection, SMARTSIG:Injection IM As Directed, Disp: , Rfl:  .  norgestimate-ethinyl estradiol (SPRINTEC 28) 0.25-35 MG-MCG tablet, Take 1 tablet by mouth daily., Disp: 3 Package, Rfl: 3 .  spironolactone (ALDACTONE) 50 MG tablet, Take 1 tablet (50 mg total) by mouth daily., Disp: 90 tablet, Rfl: 4 .  desvenlafaxine (PRISTIQ) 50 MG 24 hr tablet, Take 1  tablet (50 mg total) by mouth daily. (Patient not taking: Reported on 05/25/2020), Disp: 90 tablet, Rfl: 1 .  hydrOXYzine (ATARAX/VISTARIL) 25 MG tablet, Take 1 tablet (25 mg total) by mouth every 6 (six) hours as needed for anxiety. Take 25 mg at bedtime, Disp: 45 tablet, Rfl: 1  Allergies as of 05/25/2020 - Review Complete 05/25/2020  Allergen Reaction Noted  . Peanuts [peanut oil] Anaphylaxis 06/22/2012  . Corn-containing products  10/18/2017  . Other Other (See Comments) 07/23/2017  . Shellfish allergy Other (See Comments) 06/22/2012  . Soy allergy  10/18/2017     reports that she has never smoked. She has never used smokeless tobacco. She reports that she does not drink alcohol and does not use drugs. Pediatric History  Patient Parents  . [name],[name] (Mother)  . Fishburn,[name] (Father)   Other Topics Concern  . Not on file  Social History Narrative   Goes to [employer], Lives in [facility]    1. 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  2. Activities: Violin.   3. Primary Care Provider: [name], MD  ROS: There are no other significant problems involving Jaedah's other body systems.    Objective:  Objective  Vital Signs:   BP 112/66   Pulse 80   Wt 171 lb 3.2 oz (77.7 kg)   LMP 04/08/2020 (Approximate)   BMI 26.25 kg/m   Ht Readings from Last 3 Encounters:  09/23/19 5' 7.72" (1.72 m) (91 %, Z= 1.35)*  09/09/19 5' 7.56" (1.716 m) (90 %, Z= 1.29)*  07/11/19 5' 6.93" (1.7 m) (85 %, Z= 1.05)*   * Growth percentiles are based on CDC (Girls, 2-20 Years) data.   Wt Readings from Last 3 Encounters:  05/25/20 171 lb 3.2 oz (77.7 kg) (92 %, Z= 1.42)*  09/23/19 171 lb (77.6 kg) (93 %, Z= 1.45)*  09/09/19 166 lb 9.6 oz (75.6 kg) (91 %, Z= 1.36)*   * Growth percentiles are based on CDC (Girls, 2-20 Years) data.   HC Readings from Last 3 Encounters:  No data found for [facility]   Body surface area is  1.93 meters squared. No height on file for this encounter. 92 %ile (Z= 1.42) based on CDC (Girls, 2-20 Years) weight-for-age data using vitals from 05/25/2020.   PHYSICAL EXAM:  Constitutional: The patient appears healthy and well nourished. The patient's height and weight are advanced for age. Weight is still fairly stable.    Head: The head is normocephalic. Face: The face appears normal. There are no obvious dysmorphic features.fine hair on upper lip, and sideburns. Fine  hair on chin/front of neck. - neck and chin have improved. Side burns and upper lip remain fine hair.   Eyes: The eyes appear to be normally formed and spaced. Gaze is conjugate. There is no obvious arcus or proptosis. Moisture appears normal. Ears: The ears are normally placed and appear externally normal. Mouth: The oropharynx and tongue appear  normal. Dentition appears to be normal for age. Oral moisture is normal. Neck: The neck appears to be visibly normal. No carotid bruits are noted. The thyroid gland is normal in size. The consistency of the thyroid gland is normal. The thyroid gland is not tender to palpation. No acanthosis Lungs: No increased work of breathing Heart: Heart rate regular. Pulses and peripheral perfusion regular Abdomen: The abdomen appears to be normal in size for the patient's age. There is no obvious hepatomegaly, splenomegaly, or other mass effect.  Arms: Muscle size and bulk are normal for age.  Hands: There is no obvious tremor. Phalangeal and metacarpophalangeal joints are normal. Palmar muscles are normal for age. Palmar skin is normal. Palmar moisture is also normal. Legs: Muscles appear normal for age. No edema is present. Feet: Feet are normally formed. Dorsalis pedal pulses are normal. Neurologic: Strength is normal for age in both the upper and lower extremities. Muscle tone is normal. Sensation to touch is normal in both the legs and feet.   Puberty: Tanner stage pubic hair: V Tanner  stage breast/genital V.   LAB DATA:    Results for orders placed or performed in visit on 05/25/20 (from the past 672 hour(s))  POCT Glucose (Device for Home Use)   Collection Time: 05/25/20  4:15 PM  Result Value Ref Range   Glucose Fasting, POC     POC Glucose 99 70 - 99 mg/dl  POCT glycosylated hemoglobin (Hb A1C)   Collection Time: 05/25/20  4:30 PM  Result Value Ref Range   Hemoglobin A1C 5.0 4.0 - 5.6 %   HbA1c POC (<> result, manual entry)     HbA1c, POC (prediabetic range)     HbA1c, POC (controlled diabetic range)      Lab Results  Component Value Date   HGBA1C 5.0 05/25/2020   HGBA1C 5.5 09/09/2019   HGBA1C 5.2 07/03/2018   HGBA1C 5.3 10/16/2017   HGBA1C 5.6 04/10/2016       Assessment and Plan:  Assessment  ASSESSMENT: Krystan is a 19 y.o.  mixed race female who presents today for follow up evaluation of hirsutism and hyperandrogenism.   Hirsutism - She has had signficant laser hair removal - especially across her chest - She continues on spironolactone for anti androgen effect on hair growth - She feels that she is doing okay - Says that she will need additional laser hair removal - exam is stable  Hyperandrogenism - Continues on Sprintec OCP - Has been taking OCP consistently - Is no longer having breakthrough bleeding or menorrhagia  Sexual Health - Is taking OCP more consistently - has been following up with adolescent medicine  Weight  - she has reduced sugar drink intake - she is not getting structured physical activity currently- but is on her feet all the time at work.   Anxiety - Feels anxiety was well controlled on Atarax.  - Was previously on Prozac but felt worse on it - Is not currently taking anything for anxiety/depression - Would like refill on Atarax for school.    PLAN:    1. Diagnostic: A1C today as above.  2. Therapeutic: continue sprintec and spironolactone. Need to not skip pills.  Atarax 10 mg TID PRN anxiety. Can take  20 mg before bed.  3. Patient education: Discussed the above.  4. Follow-up: Return in about 6 months (around 11/25/2020).       Dessa Phi, MD  Level of Service:  >30 minutes spent today reviewing the medical  chart, counseling the patient/family, and documenting today's encounter.

## 2020-05-26 ENCOUNTER — Other Ambulatory Visit (INDEPENDENT_AMBULATORY_CARE_PROVIDER_SITE_OTHER): Payer: Self-pay | Admitting: Pediatric Endocrinology

## 2020-05-26 DIAGNOSIS — L680 Hirsutism: Secondary | ICD-10-CM

## 2020-07-06 ENCOUNTER — Encounter (INDEPENDENT_AMBULATORY_CARE_PROVIDER_SITE_OTHER): Payer: Self-pay

## 2020-11-25 ENCOUNTER — Telehealth (INDEPENDENT_AMBULATORY_CARE_PROVIDER_SITE_OTHER): Payer: Self-pay | Admitting: Pediatric Endocrinology

## 2020-11-25 ENCOUNTER — Telehealth (INDEPENDENT_AMBULATORY_CARE_PROVIDER_SITE_OTHER): Payer: BC Managed Care – PPO | Admitting: Pediatric Endocrinology

## 2020-11-25 ENCOUNTER — Encounter (INDEPENDENT_AMBULATORY_CARE_PROVIDER_SITE_OTHER): Payer: Self-pay

## 2020-11-25 NOTE — Telephone Encounter (Signed)
ERROR

## 2020-11-29 ENCOUNTER — Encounter (INDEPENDENT_AMBULATORY_CARE_PROVIDER_SITE_OTHER): Payer: Self-pay | Admitting: Pediatric Endocrinology

## 2020-11-29 ENCOUNTER — Telehealth (INDEPENDENT_AMBULATORY_CARE_PROVIDER_SITE_OTHER): Payer: BC Managed Care – PPO | Admitting: Pediatric Endocrinology

## 2020-11-29 ENCOUNTER — Other Ambulatory Visit: Payer: Self-pay

## 2020-11-29 VITALS — Wt 177.0 lb

## 2020-11-29 DIAGNOSIS — F411 Generalized anxiety disorder: Secondary | ICD-10-CM | POA: Diagnosis not present

## 2020-11-29 DIAGNOSIS — E288 Other ovarian dysfunction: Secondary | ICD-10-CM

## 2020-11-29 DIAGNOSIS — L680 Hirsutism: Secondary | ICD-10-CM

## 2020-11-29 DIAGNOSIS — Z3041 Encounter for surveillance of contraceptive pills: Secondary | ICD-10-CM

## 2020-11-29 MED ORDER — HYDROXYZINE HCL 25 MG PO TABS: 25.0000 mg | ORAL_TABLET | Freq: Four times a day (QID) | ORAL | 1 refills | Status: AC | PRN

## 2020-11-29 MED ORDER — DESOGESTREL-ETHINYL ESTRADIOL 0.15-30 MG-MCG PO TABS: 1.0000 | ORAL_TABLET | Freq: Every day | ORAL | 11 refills | Status: AC

## 2020-11-29 NOTE — Progress Notes (Signed)
This is a Pediatric Specialist E-Visit follow up consult provided via Angela Fischer.  Location of Fischer: Angela Fischer is at [facility])  Location of provider: Koren Fischer is at Pediatric Specialist Fischer was referred by Angela Dimitri, MD   The following participants were involved in this E-Visit: Angela Fischer, RMA Angela Phi, MD Angela Fischer  Chief Complain/ Reason for E-Visit Fischer:  Total time on call: 24 min Follow up: 6 months   Subjective:  Subjective  Fischer Name: Angela Fischer Date of Birth: 08-21-2001  MRN: [id number]  Angela Fischer  Presents Via Angela for follow up evaluation and management of her hirsutism.   HISTORY OF PRESENT ILLNESS:   Angela Fischer is a 20 y.o. mixed female   Angela Fischer was unaccompanied    1. Areli was seen by her PCP in June 2017 for her 14 year WCC. At that visit she complained of excess body hair on her torso and face. She was referred to endocrinology for further evaluation.    2. Angela Fischer was last seen in pediatric endocrine clinic on 05/25/20. In the interim she has been generally healthy.    She has injured her wrists and is currently not able to play her violin as much as she wants. She didn't play at all last semester. She is currently getting physical therapy.   She is not currently taking Atarax. She has had some anxiety but didn't refill the Atarax.   She stopped taking the Sprintec because she was having menorrhagia on the pill. She had her period for 6 weeks and it stopped a week after she stopped taking her Sprintec. She tried again and she felt that the same thing happened.   Since stopping the Sprintec she feels that she has had increased unwanted hair growth.   She has continued on Spironolactone.   She is not taking any medication for anxiety or depression.     3. Pertinent Review of Systems:  Constitutional: The Fischer feels "tired". The  Fischer seems healthy and active.  Eyes: Vision seems to be good. There are no recognized eye problems. Wears glasses.  Neck: The Fischer has no complaints of anterior neck swelling, soreness, tenderness, pressure, discomfort, or difficulty swallowing.   Heart: Heart rate increases with exercise or other physical activity. The Fischer has no complaints of palpitations, irregular heart beats, chest pain, or chest pressure.   Lungs: h/o asthma. Gastrointestinal: Bowel movents seem normal. The Fischer has no complaints of excessive hunger, acid reflux, upset stomach, stomach aches or pains, diarrhea, or constipation.  Legs: Muscle mass and strength seem normal. There are no complaints of numbness, tingling, burning, or pain. No edema is noted.  Feet: There are no obvious foot problems. There are no complaints of numbness, tingling, burning, or pain. No edema is noted. Neurologic: There are no recognized problems with muscle movement and strength, sensation, or coordination. GYN/GU: per HPI  Skin: s/p hair removal. Hair is growing back. She is doing hair removal every other day. She is using tweezers or single blade razor.   PAST MEDICAL, FAMILY, AND SOCIAL HISTORY  Past Medical History:  Diagnosis Date  . Asthma   . PCOS (polycystic ovarian syndrome)   . Spinal enthesopathy (HCC)     Family History  Problem Relation Age of Onset  . Arthritis Maternal Grandmother   . Asthma Maternal Grandmother   . Heart disease Maternal Grandmother   . Hypertension Maternal Grandmother   .  Diabetes Maternal Grandmother   . Diabetes Paternal Grandmother   . Sleep apnea Paternal Grandmother      Current Outpatient Medications:  .  albuterol (VENTOLIN HFA) 108 (90 Base) MCG/ACT inhaler, Inhale into the lungs., Disp: , Rfl:  .  desogestrel-ethinyl estradiol (APRI) 0.15-30 MG-MCG tablet, Take 1 tablet by mouth daily., Disp: 28 tablet, Rfl: 11 .  spironolactone (ALDACTONE) 50 MG tablet, Take 1 tablet (50  mg total) by mouth daily., Disp: 90 tablet, Rfl: 4 .  cetirizine (ZYRTEC) 10 MG chewable tablet, Chew 10 mg by mouth daily., Disp: , Rfl:  .  desvenlafaxine (PRISTIQ) 50 MG 24 hr tablet, Take 1 tablet (50 mg total) by mouth daily. (Fischer not taking: No sig reported), Disp: 90 tablet, Rfl: 1 .  EPINEPHrine 0.3 mg/0.3 mL IJ SOAJ injection, SMARTSIG:Injection IM As Directed (Fischer not taking: Reported on 11/29/2020), Disp: , Rfl:  .  hydrOXYzine (ATARAX/VISTARIL) 25 MG tablet, Take 1 tablet (25 mg total) by mouth every 6 (six) hours as needed for anxiety. Take 25 mg at bedtime, Disp: 45 tablet, Rfl: 1  Allergies as of 11/29/2020 - Review Complete 11/29/2020  Allergen Reaction Noted  . Peanuts [peanut oil] Anaphylaxis 06/22/2012  . Corn-containing products  10/18/2017  . Sertraline Other (See Comments) 10/11/2020  . Shellfish allergy Other (See Comments) 06/22/2012  . Soy allergy  10/18/2017  . Other Other (See Comments) and Anxiety 07/23/2017     reports that she has never smoked. She has never used smokeless tobacco. She reports that she does not drink alcohol and does not use drugs. Pediatric History  Fischer Parents  . Angela Fischer,Angela Fischer (Mother)  . Angela Fischer,Angela Fischer (Father)   Other Topics Concern  . Not on file  Social History Narrative   Goes to [employer], Lives in [facility]    1. 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  2. Activities: Violin.   3. Primary Care Provider: [name], MD  ROS: There are no other significant problems involving Angela Fischer other body systems.    Objective:  Objective  Vital Signs:  Virtual Visit  Wt 177 lb (80.3 kg)   LMP 11/25/2020   BMI 27.14 kg/m   Ht Readings from Last 3 Encounters:  09/23/19 5' 7.72" (1.72 m) (91 %, Z= 1.35)*  09/09/19 5' 7.56" (1.716 m) (90 %, Z= 1.29)*  07/11/19 5' 6.93" (1.7 m) (85 %, Z= 1.05)*   * Growth percentiles are based on CDC (Girls,  2-20 Years) data.   Wt Readings from Last 3 Encounters:  11/29/20 177 lb (80.3 kg)  05/25/20 171 lb 3.2 oz (77.7 kg) (92 %, Z= 1.42)*  09/23/19 171 lb (77.6 kg) (93 %, Z= 1.45)*   * Growth percentiles are based on CDC (Girls, 2-20 Years) data.   HC Readings from Last 3 Encounters:  No data found for [facility]   Body surface area is 1.96 meters squared. Facility age limit for growth percentiles is 20 years. Facility age limit for growth percentiles is 20 years.   PHYSICAL EXAM:  Virtual visit  Gen: awake alert no distress Normal work of breathing Normal oral moisture No visible goiter.     LAB DATA:     No results found for this or any previous visit (from the past 672 hour(s)).  Lab Results  Component Value Date   HGBA1C 5.0 05/25/2020   HGBA1C 5.5 09/09/2019   HGBA1C 5.2 07/03/2018   HGBA1C 5.3 10/16/2017  HGBA1C 5.6 04/10/2016       Assessment and Plan:  Assessment  ASSESSMENT: Aylanie is a 20 y.o.  mixed race female who presents Fischer for follow up evaluation of hirsutism and hyperandrogenism.    Hirsutism - She has had signficant laser hair removal - especially across her chest - She continues on spironolactone for anti androgen effect on hair growth - She feels that hair growth has increased - Says that she will need additional laser hair removal  Hyperandrogenism - Not currently taking OCP - Had issues with menorrhagia on Sprintec this winter and stopped taking it.   Anxiety - Feels anxiety was well controlled on Atarax.  - Was previously on Prozac but felt worse on it - Is not currently taking anything for anxiety/depression - Is currently out of Atarax.   PLAN:     1. Diagnostic: None - Virtual Visit 2. Therapeutic: Restart Atarax 10 mg TID PRN anxiety. Can take 20 mg before bed. Start Apri OCP.  3. Fischer education: Discussed the above.  4. Follow-up: Return in about 6 months (around 05/29/2021).       Angela Phi, MD  Level of Service:   >30 minutes spent Fischer reviewing the medical chart, counseling the Fischer/family, and documenting Fischer's encounter.

## 2021-04-18 DIAGNOSIS — Z23 Encounter for immunization: Secondary | ICD-10-CM | POA: Diagnosis not present

## 2021-05-18 DIAGNOSIS — Z0100 Encounter for examination of eyes and vision without abnormal findings: Secondary | ICD-10-CM | POA: Diagnosis not present

## 2021-06-14 DIAGNOSIS — M25571 Pain in right ankle and joints of right foot: Secondary | ICD-10-CM | POA: Diagnosis not present

## 2021-08-18 DIAGNOSIS — L089 Local infection of the skin and subcutaneous tissue, unspecified: Secondary | ICD-10-CM | POA: Diagnosis not present

## 2023-02-21 DIAGNOSIS — E288 Other ovarian dysfunction: Secondary | ICD-10-CM | POA: Diagnosis not present

## 2023-02-21 DIAGNOSIS — E282 Polycystic ovarian syndrome: Secondary | ICD-10-CM | POA: Diagnosis not present

## 2023-04-13 ENCOUNTER — Encounter (INDEPENDENT_AMBULATORY_CARE_PROVIDER_SITE_OTHER): Payer: Self-pay

## 2023-04-16 DIAGNOSIS — E282 Polycystic ovarian syndrome: Secondary | ICD-10-CM | POA: Diagnosis not present

## 2023-04-23 DIAGNOSIS — E282 Polycystic ovarian syndrome: Secondary | ICD-10-CM | POA: Diagnosis not present

## 2024-06-23 DIAGNOSIS — F4323 Adjustment disorder with mixed anxiety and depressed mood: Secondary | ICD-10-CM | POA: Diagnosis not present

## 2024-06-23 DIAGNOSIS — F419 Anxiety disorder, unspecified: Secondary | ICD-10-CM | POA: Diagnosis not present

## 2024-07-29 DIAGNOSIS — L680 Hirsutism: Secondary | ICD-10-CM | POA: Diagnosis not present

## 2024-07-29 DIAGNOSIS — Z113 Encounter for screening for infections with a predominantly sexual mode of transmission: Secondary | ICD-10-CM | POA: Diagnosis not present

## 2024-07-29 DIAGNOSIS — E282 Polycystic ovarian syndrome: Secondary | ICD-10-CM | POA: Diagnosis not present

## 2024-07-29 DIAGNOSIS — N943 Premenstrual tension syndrome: Secondary | ICD-10-CM | POA: Diagnosis not present

## 2024-08-12 DIAGNOSIS — E282 Polycystic ovarian syndrome: Secondary | ICD-10-CM | POA: Diagnosis not present

## 2024-08-12 DIAGNOSIS — N83291 Other ovarian cyst, right side: Secondary | ICD-10-CM | POA: Diagnosis not present

## 2024-08-12 DIAGNOSIS — Z131 Encounter for screening for diabetes mellitus: Secondary | ICD-10-CM | POA: Diagnosis not present

## 2024-08-12 DIAGNOSIS — N943 Premenstrual tension syndrome: Secondary | ICD-10-CM | POA: Diagnosis not present
